# Patient Record
Sex: Female | Born: 1975 | Race: White | Hispanic: No | Smoking: Never smoker
Health system: Southern US, Community
[De-identification: ages and names within clinical notes are randomized; demographics above are authoritative.]

---

## 2006-03-24 ENCOUNTER — Inpatient Hospital Stay: Payer: Self-pay | Admitting: Obstetrics and Gynecology

## 2010-08-24 ENCOUNTER — Ambulatory Visit: Payer: Self-pay | Admitting: Family Medicine

## 2011-03-25 ENCOUNTER — Ambulatory Visit: Payer: Self-pay

## 2012-10-15 ENCOUNTER — Emergency Department: Payer: Self-pay | Admitting: Emergency Medicine

## 2012-10-15 LAB — COMPREHENSIVE METABOLIC PANEL
Albumin: 4.1 g/dL (ref 3.4–5.0)
Alkaline Phosphatase: 89 U/L (ref 50–136)
BUN: 10 mg/dL (ref 7–18)
Bilirubin,Total: 0.2 mg/dL (ref 0.2–1.0)
Calcium, Total: 8.8 mg/dL (ref 8.5–10.1)
Co2: 29 mmol/L (ref 21–32)
Creatinine: 0.75 mg/dL (ref 0.60–1.30)
EGFR (African American): >60
EGFR (Non-African Amer.): >60
Glucose: 98 mg/dL (ref 65–99)
Osmolality: 277 (ref 275–301)
Potassium: 3.7 mmol/L (ref 3.5–5.1)
SGOT(AST): 25 U/L (ref 15–37)
Total Protein: 7.5 g/dL (ref 6.4–8.2)

## 2012-10-15 LAB — CBC WITH DIFFERENTIAL/PLATELET
Eosinophil #: 0.1 10*3/uL (ref 0.0–0.7)
Eosinophil %: 0.5 %
HCT: 39.4 % (ref 35.0–47.0)
Lymphocyte #: 1.4 10*3/uL (ref 1.0–3.6)
MCH: 31 pg (ref 26.0–34.0)
MCHC: 33.8 g/dL (ref 32.0–36.0)
Monocyte #: 0.7 x10 3/mm (ref 0.2–0.9)
Monocyte %: 5 %
Neutrophil %: 82.8 %
Platelet: 277 10*3/uL (ref 150–440)
RDW: 13.6 % (ref 11.5–14.5)

## 2013-03-19 ENCOUNTER — Emergency Department: Payer: Self-pay | Admitting: Internal Medicine

## 2013-04-03 ENCOUNTER — Ambulatory Visit: Payer: Self-pay | Admitting: Orthopedic Surgery

## 2013-08-21 ENCOUNTER — Ambulatory Visit: Payer: Self-pay | Admitting: Family Medicine

## 2013-08-30 ENCOUNTER — Ambulatory Visit: Payer: Self-pay | Admitting: Family Medicine

## 2015-01-15 ENCOUNTER — Other Ambulatory Visit: Payer: Self-pay | Admitting: Family Medicine

## 2015-01-15 DIAGNOSIS — E042 Nontoxic multinodular goiter: Secondary | ICD-10-CM

## 2015-01-17 ENCOUNTER — Ambulatory Visit: Admission: RE | Admit: 2015-01-17 | Payer: Self-pay | Source: Ambulatory Visit

## 2015-01-21 ENCOUNTER — Ambulatory Visit
Admission: RE | Admit: 2015-01-21 | Discharge: 2015-01-21 | Disposition: A | Discharge status: Home / Self Care | Payer: 59 | Source: Ambulatory Visit | Attending: Family Medicine | Admitting: Family Medicine

## 2015-01-21 DIAGNOSIS — E042 Nontoxic multinodular goiter: Secondary | ICD-10-CM

## 2015-07-07 ENCOUNTER — Encounter: Payer: Self-pay | Admitting: Gynecology

## 2015-07-07 ENCOUNTER — Ambulatory Visit
Admission: EM | Admit: 2015-07-07 | Discharge: 2015-07-07 | Disposition: A | Discharge status: Home / Self Care | Payer: 59 | Attending: Family Medicine | Admitting: Family Medicine

## 2015-07-07 DIAGNOSIS — J9801 Acute bronchospasm: Secondary | ICD-10-CM

## 2015-07-07 DIAGNOSIS — H6503 Acute serous otitis media, bilateral: Secondary | ICD-10-CM | POA: Diagnosis not present

## 2015-07-07 MED ORDER — ALBUTEROL SULFATE HFA 108 (90 BASE) MCG/ACT IN AERS: 2.0000 | INHALATION_SPRAY | Freq: Four times a day (QID) | RESPIRATORY_TRACT | Status: AC | PRN

## 2015-07-07 NOTE — ED Notes (Signed)
Patient c/o x 2 weeks ago congestion coughing fever of 101. Patient stated saw her PCP x 1 week ago and diagnose with ear infection an is on Augmentin for 10 days. However patient stated is having chest congestion.

## 2015-07-07 NOTE — ED Provider Notes (Signed)
CSN: [id number]     Arrival date & time 07/07/15  1228 History   First MD Initiated Contact with Patient 07/07/15 1511     Chief Complaint  Patient presents with  . URI   (Consider location/radiation/quality/duration/timing/severity/associated sxs/prior Treatment) Patient is a 40 y.o. female presenting with URI. The history is provided by the patient.  URI Presenting symptoms: congestion, cough, fatigue and fever   Severity:  Moderate Onset quality:  Sudden Duration:  14 days Timing:  Constant Progression:  Improving Chronicity:  New Relieved by:  Prescription medications (patient on Augmentin prescribed by for ear infection) Associated symptoms: wheezing   Associated symptoms: no sinus pain   Risk factors: not elderly, no chronic cardiac disease, no chronic kidney disease, no chronic respiratory disease, no diabetes mellitus, no immunosuppression, no recent illness and no recent travel     History reviewed. No pertinent past medical history. History reviewed. No pertinent past surgical history. No family history on file. Social History  Substance Use Topics  . Smoking status: Never Smoker   . Smokeless tobacco: None  . Alcohol Use: No   OB History    No data available     Review of Systems  Constitutional: Positive for fever and fatigue.  HENT: Positive for congestion.   Respiratory: Positive for cough and wheezing.     Allergies  Review of patient's allergies indicates no known allergies.  Home Medications   Prior to Admission medications   Medication Sig Start Date End Date Taking? Authorizing Provider  Amoxicillin-Pot Clavulanate (AUGMENTIN PO) Take by mouth.   Yes Historical Provider, MD  albuterol (PROVENTIL HFA;VENTOLIN HFA) 108 (90 Base) MCG/ACT inhaler Inhale 2 puffs into the lungs every 6 (six) hours as needed for wheezing or shortness of breath. 07/07/15   Payton Mccallum, MD   Meds Ordered and Administered this Visit  Medications - No data to  display  BP 121/93 mmHg  Pulse 76  Temp(Src) 98 F (36.7 C) (Oral)  Resp 18  Ht  (1.549 m)  Wt 132 lb (59.875 kg)  BMI 24.95 kg/m2  SpO2 99%  LMP 07/07/2015 No data found.   Physical Exam  Constitutional: She appears well-developed and well-nourished. No distress.  HENT:  Head: Normocephalic and atraumatic.  Right Ear: Tympanic membrane, external ear and ear canal normal.  Left Ear: Tympanic membrane, external ear and ear canal normal.  Nose: No mucosal edema, rhinorrhea, nose lacerations, sinus tenderness, nasal deformity, septal deviation or nasal septal hematoma. No epistaxis.  No foreign bodies. Right sinus exhibits no maxillary sinus tenderness and no frontal sinus tenderness. Left sinus exhibits no maxillary sinus tenderness and no frontal sinus tenderness.  Mouth/Throat: Uvula is midline, oropharynx is clear and moist and mucous membranes are normal. No oropharyngeal exudate.  Eyes: Conjunctivae and EOM are normal. Pupils are equal, round, and reactive to light. Right eye exhibits no discharge. Left eye exhibits no discharge. No scleral icterus.  Neck: Normal range of motion. Neck supple. No thyromegaly present.  Cardiovascular: Normal rate, regular rhythm and normal heart sounds.   Pulmonary/Chest: Effort normal. No respiratory distress. She has wheezes (few mild expiratory ). She has no rales.  Lymphadenopathy:    She has no cervical adenopathy.  Skin: She is not diaphoretic.  Nursing note and vitals reviewed.   ED Course  Procedures (including critical care time)  Labs Review Labs Reviewed - No data to display  Imaging Review No results found.   Visual Acuity Review  Right Eye Distance:  Left Eye Distance:   Bilateral Distance:    Right Eye Near:   Left Eye Near:    Bilateral Near:         MDM   1. Bronchospasm   2. Bilateral acute serous otitis media, recurrence not specified     Discharge Medication List as of 07/07/2015  3:25 PM     START taking these medications   Details  albuterol (PROVENTIL HFA;VENTOLIN HFA) 108 (90 Base) MCG/ACT inhaler Inhale 2 puffs into the lungs every 6 (six) hours as needed for wheezing or shortness of breath., Starting 07/07/2015, Until Discontinued, Normal       1. diagnosis reviewed with patient 2. rx as per orders above; reviewed possible side effects, interactions, risks and benefits  3. Recommend continue and finish current antibiotic course with augmentin 4. Follow-up prn if symptoms worsen or don't improve    Payton Mccallum, MD 07/11/15 1318

## 2015-07-18 ENCOUNTER — Ambulatory Visit
Admission: RE | Admit: 2015-07-18 | Discharge: 2015-07-18 | Disposition: A | Discharge status: Home / Self Care | Payer: 59 | Source: Ambulatory Visit | Attending: Family Medicine | Admitting: Family Medicine

## 2015-07-18 ENCOUNTER — Other Ambulatory Visit: Payer: Self-pay | Admitting: Family Medicine

## 2015-07-18 DIAGNOSIS — R059 Cough, unspecified: Secondary | ICD-10-CM

## 2015-07-18 DIAGNOSIS — R05 Cough: Secondary | ICD-10-CM

## 2015-08-21 ENCOUNTER — Ambulatory Visit
Admission: EM | Admit: 2015-08-21 | Discharge: 2015-08-21 | Disposition: A | Discharge status: Home / Self Care | Payer: 59 | Attending: Family Medicine | Admitting: Family Medicine

## 2015-08-21 ENCOUNTER — Encounter: Payer: Self-pay | Admitting: Emergency Medicine

## 2015-08-21 ENCOUNTER — Ambulatory Visit (INDEPENDENT_AMBULATORY_CARE_PROVIDER_SITE_OTHER): Payer: 59

## 2015-08-21 DIAGNOSIS — L089 Local infection of the skin and subcutaneous tissue, unspecified: Secondary | ICD-10-CM | POA: Diagnosis not present

## 2015-08-21 DIAGNOSIS — S92912A Unspecified fracture of left toe(s), initial encounter for closed fracture: Secondary | ICD-10-CM

## 2015-08-21 MED ORDER — AMOXICILLIN 500 MG PO CAPS: 500.0000 mg | ORAL_CAPSULE | Freq: Two times a day (BID) | ORAL | Status: DC

## 2015-08-21 NOTE — ED Notes (Signed)
Pt has pain left 4th toe, bruising. Pt reports she stubbed her toe on concrete 2 days ago. Also has area of redness with pain just below toe nail.  Pt thinks its a piece of bone sticking out, but also looks like possible infection.

## 2015-08-21 NOTE — ED Provider Notes (Signed)
CSN: [id number]     Arrival date & time 08/21/15  [id number] History   First MD Initiated Contact with Patient 08/21/15 [phone]     Chief Complaint  Patient presents with  . Toe Pain   (Consider location/radiation/quality/duration/timing/severity/associated sxs/prior Treatment) HPI: She presents today with left fourth toe bruising. Patient states that she stubbed her toe on concrete about 2 days ago. The area is now red and painful. She feels as if there could be a fracture of the toe. She denies any drainage from the toe or any bleeding from the toe. She has been limping due to the discomfort. She denies any previous injury to the toe in the past. She denies any other injury anywhere else.  History reviewed. No pertinent past medical history. History reviewed. No pertinent past surgical history. History reviewed. No pertinent family history. Social History  Substance Use Topics  . Smoking status: Never Smoker   . Smokeless tobacco: None  . Alcohol Use: No   OB History    No data available     Review of Systems: Negative except mentioned above.  Allergies  Review of patient's allergies indicates no known allergies.  Home Medications   Prior to Admission medications   Medication Sig Start Date End Date Taking? Authorizing Provider  albuterol (PROVENTIL HFA;VENTOLIN HFA) 108 (90 Base) MCG/ACT inhaler Inhale 2 puffs into the lungs every 6 (six) hours as needed for wheezing or shortness of breath. 07/07/15   Payton Mccallumrlando Conty, MD  Amoxicillin-Pot Clavulanate (AUGMENTIN PO) Take by mouth.    Historical Provider, MD   Meds Ordered and Administered this Visit  Medications - No data to display  BP 120/89 mmHg  Pulse 68  Temp(Src) 98 F (36.7 C) (Oral)  Resp 18  Ht 5\' 1"  (1.549 m)  Wt 141 lb (63.957 kg)  BMI 26.66 kg/m2  SpO2 98% No data found.   Physical Exam  GENERAL: NAD RESP: CTA B CARD: RRR EXTREM: L Foot- moderate tenderness and erythema distally along the dorsal surface of the  fourth toe, has mildly decreased range of motion due to pain and swelling, no discharge or bleeding noted, neurovascularly intact NEURO: CN II-XII grossly intact   ED Course  Procedures (including critical care time)  Labs Review Labs Reviewed - No data to display  Imaging Review No results found.    MDM  A/P: L 4th Toe Fracture- Small avulsion fracture noted on x-ray from the distal aspect of the toe, given the redness and tenderness of the area that is focal I will treat with antibiotic as well in case the patient does have an abrasion over that area that is causing infection, of asked the patient to keep the area dry and clean and to watch for any further signs of infection, she can buddy tape the toe to the adjacent toe. If she has any further problems with the toe she follow up with podiatry as discussed. I have offered a postop shoe for her if she would like to wear this as well.    Jolene ProvostKirtida Mahesh Sizemore, MD 08/21/15 [phone]

## 2015-08-26 ENCOUNTER — Ambulatory Visit (INDEPENDENT_AMBULATORY_CARE_PROVIDER_SITE_OTHER): Payer: 59

## 2015-08-26 ENCOUNTER — Encounter: Payer: Self-pay | Admitting: Podiatry

## 2015-08-26 ENCOUNTER — Ambulatory Visit (INDEPENDENT_AMBULATORY_CARE_PROVIDER_SITE_OTHER): Payer: 59 | Admitting: Podiatry

## 2015-08-26 VITALS — BP 120/78 | HR 72 | Resp 16

## 2015-08-26 DIAGNOSIS — M6740 Ganglion, unspecified site: Secondary | ICD-10-CM | POA: Diagnosis not present

## 2015-08-26 DIAGNOSIS — S92912A Unspecified fracture of left toe(s), initial encounter for closed fracture: Secondary | ICD-10-CM | POA: Diagnosis not present

## 2015-08-26 DIAGNOSIS — S99922A Unspecified injury of left foot, initial encounter: Secondary | ICD-10-CM

## 2015-08-26 NOTE — Progress Notes (Signed)
   Subjective:    Patient ID: Brandi Sosa, female    DOB: 11/12/1975, 40 y.o.   MRN: [id number]  HPI: She presents today with a week long duration of a painful fourth digit left foot. She states that one week ago if she hit the toe against a concrete barrier. She has since gone to the ER who stated she has small fracture dorsal aspect of the DIPJ.    Review of Systems  Musculoskeletal: Positive for back pain.  All other systems reviewed and are negative.      Objective:   Physical Exam: Vital signs are stable she is alert and oriented 3 pulses are palpable. Neurologic sensorium is intact. Deep tendon reflexes are intact. Muscle strength is normal bilateral. Rectus foot type with mallet toe deformity fourth digit left foot with tenderness on palpation overlying the DIPJ and mild erythema. Cutaneous evaluation demonstrates no open lesions or wounds. However radiographs reviewed does demonstrate what appears to be a small avulsion fracture at the distal aspect of the extensor digitorum longus tendon slip. She is unable to extend the joint at the level of the DIPJ. More than likely she has not only an avulsion fracture here but also a avulsion of the extensor tendon. There also appears to be a small ganglion or mucoid cyst developing medial aspect just proximal to the proximal nail fold.        Assessment & Plan:  Avulsion fracture distal phalanx fourth digit left foot. Also will mucoid cyst.  Plan: We discussed the etiology pathology conservative versus surgical therapies. We discussed the possible need for surgical correction. I demonstrated to her how to dress the toe with compression dressing and she will do so for another 6 weeks I will follow-up with her at that time.

## 2015-10-09 ENCOUNTER — Encounter (INDEPENDENT_AMBULATORY_CARE_PROVIDER_SITE_OTHER): Payer: 59 | Admitting: Podiatry

## 2015-10-09 NOTE — Progress Notes (Signed)
This encounter was created in error - please disregard.

## 2017-06-21 ENCOUNTER — Other Ambulatory Visit: Payer: Self-pay | Admitting: Family Medicine

## 2017-06-21 DIAGNOSIS — E042 Nontoxic multinodular goiter: Secondary | ICD-10-CM

## 2017-06-25 ENCOUNTER — Ambulatory Visit: Payer: Managed Care, Other (non HMO) | Attending: Family Medicine

## 2017-10-29 IMAGING — CR DG CHEST 2V
2 series · 2 of 2 positions shown · non-contrast
Comparison: Of

CLINICAL DATA: Pneumonia July 08, 2015, patient still
experiencing productive cough shortness of breath and fever

EXAM:
CHEST  2 VIEW

[chest pa]
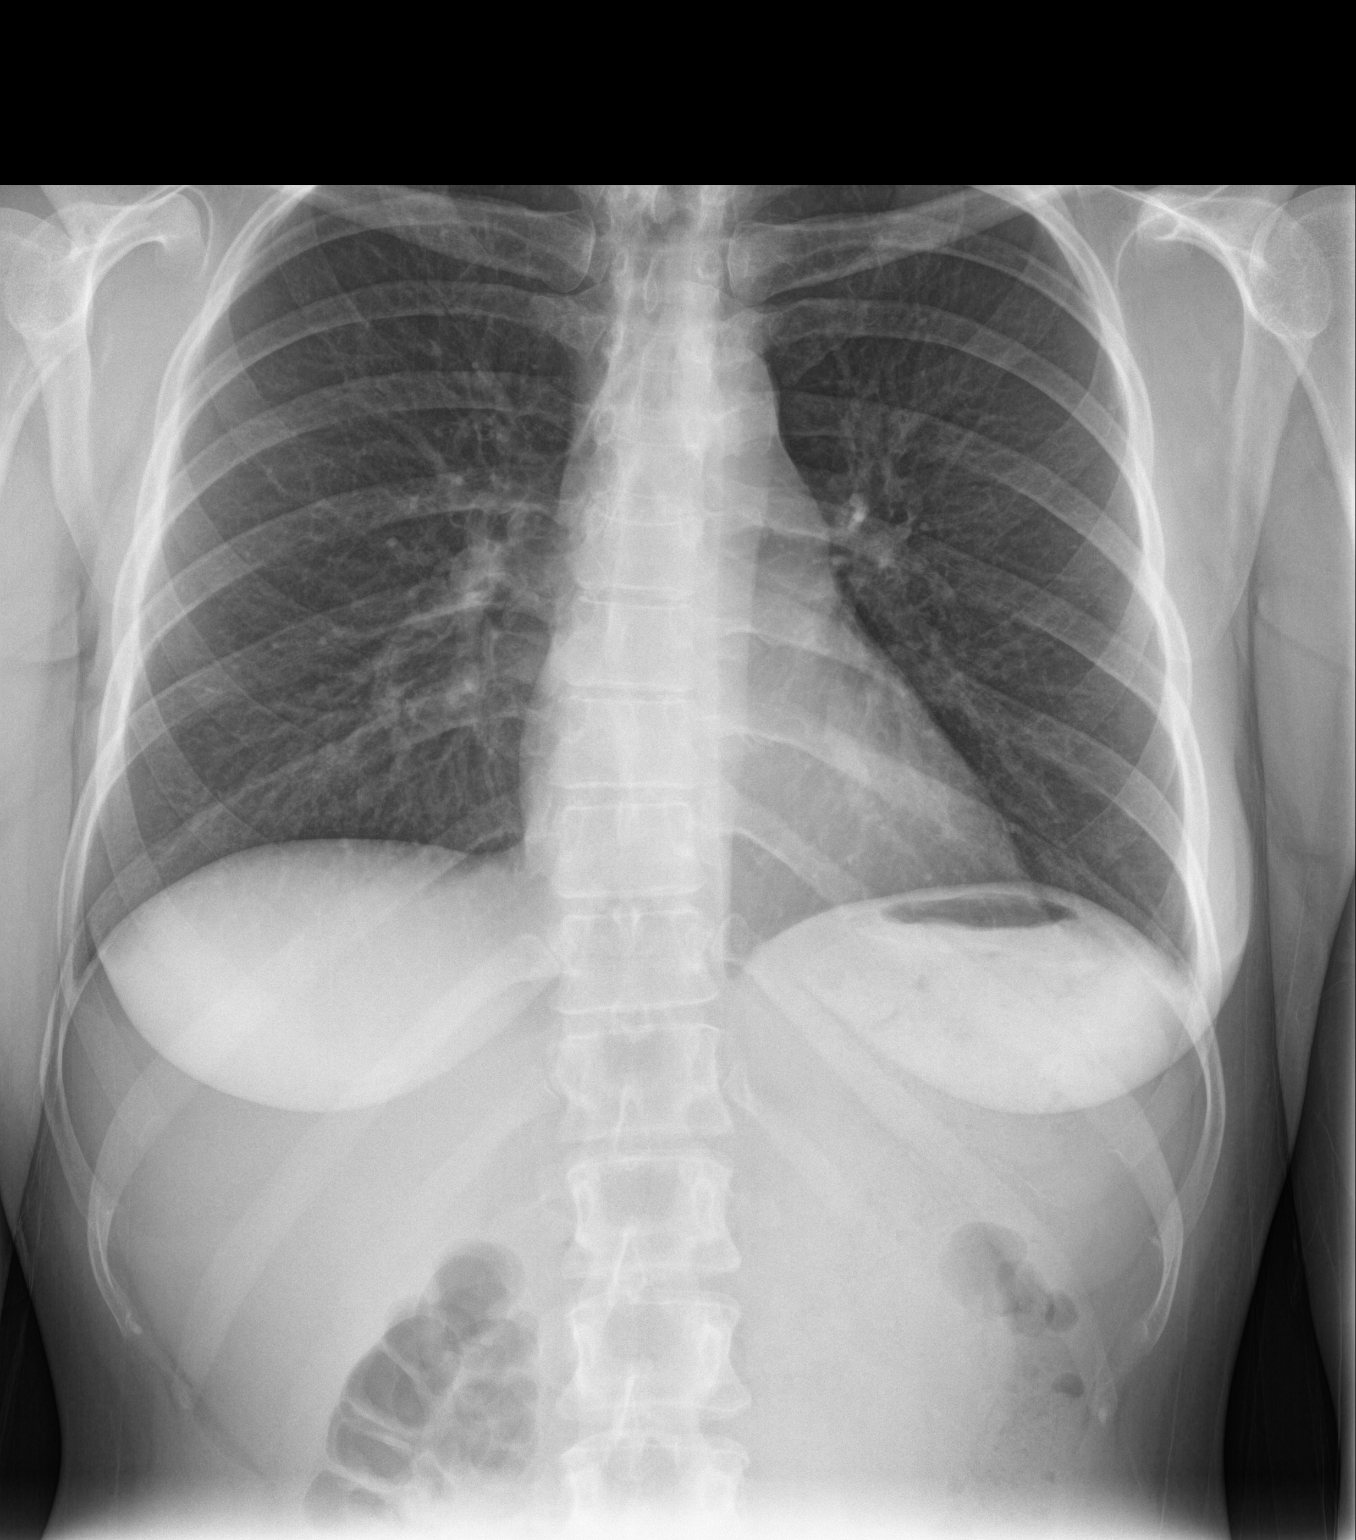

[chest lat]
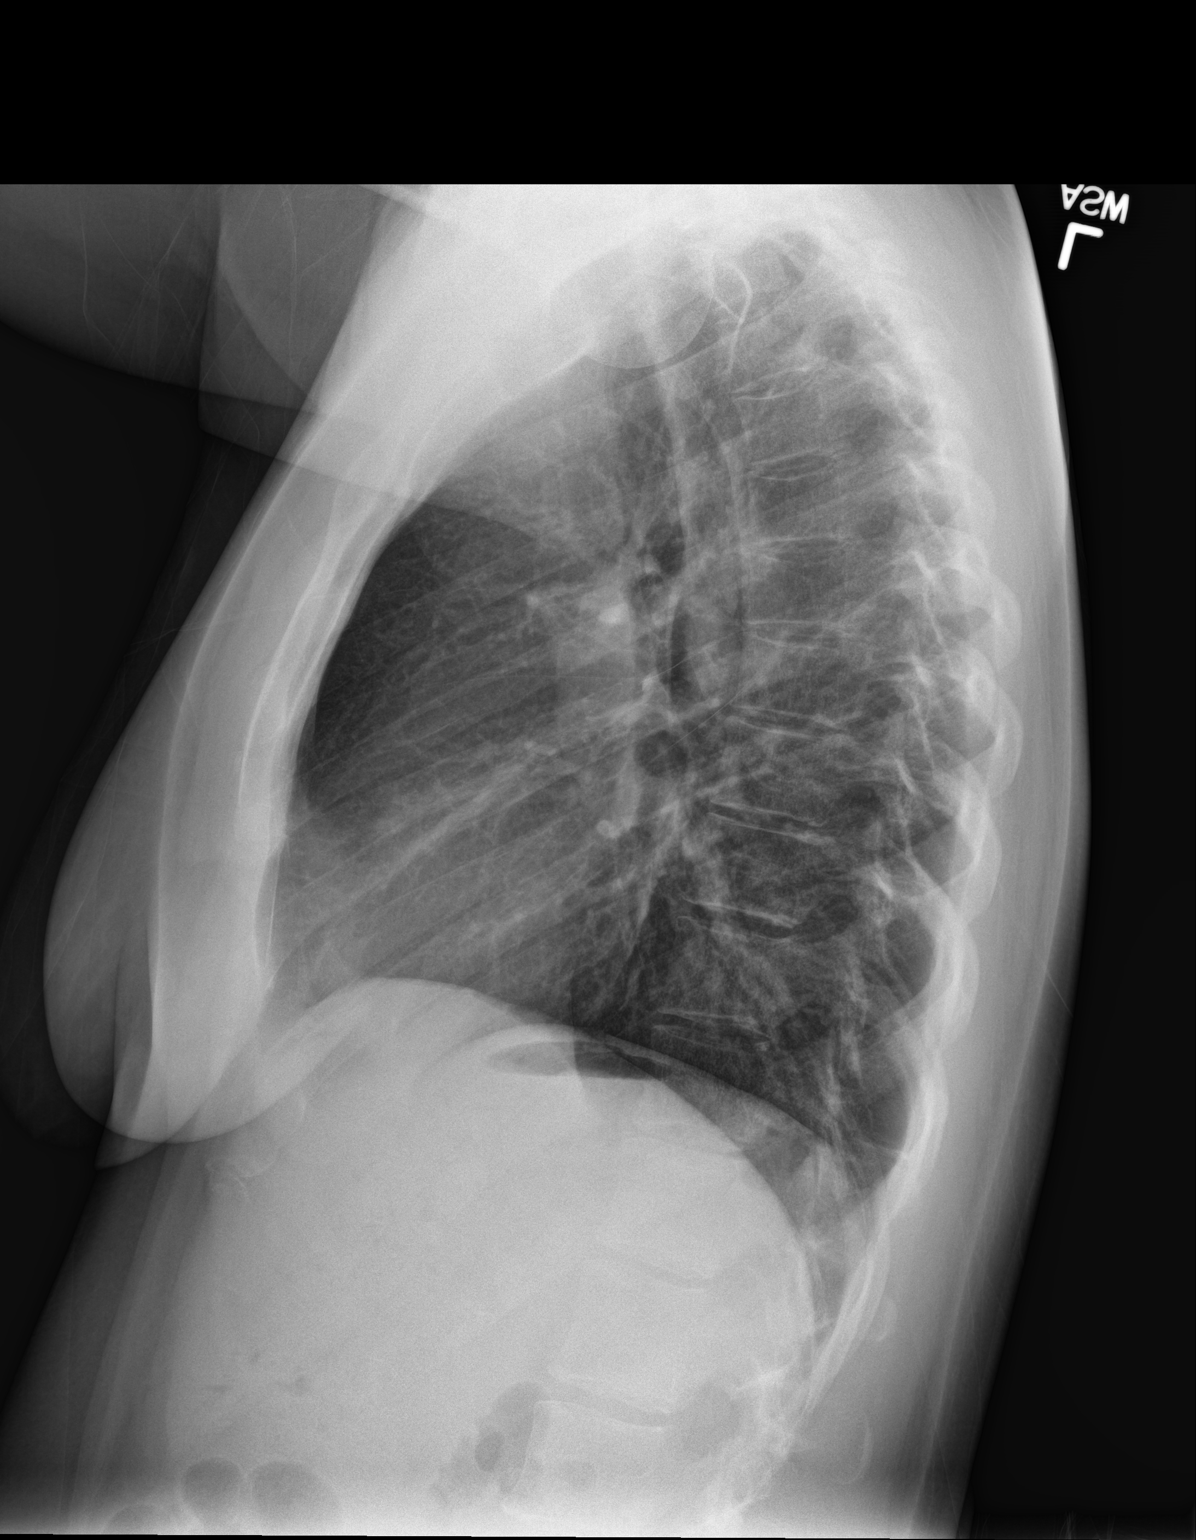

[2 of 2 positions shown; findings below may reference images not displayed]

FINDINGS: The heart size and vascular pattern are normal. There is no
infiltrate or consolidation. There is no pleural effusion. Minimal
bronchitic change.
IMPRESSION: Minimal bronchitic change of unknown chronicity. No evidence of
infectious infiltrate.

## 2017-12-02 IMAGING — CR DG TOE 4TH 2+V*L*
3 series · 3 of 3 positions shown · non-contrast
Comparison: None.

CLINICAL DATA: Injured toe 2 days ago with pain and swelling

EXAM:
LEFT FOURTH TOE

[toe ap]
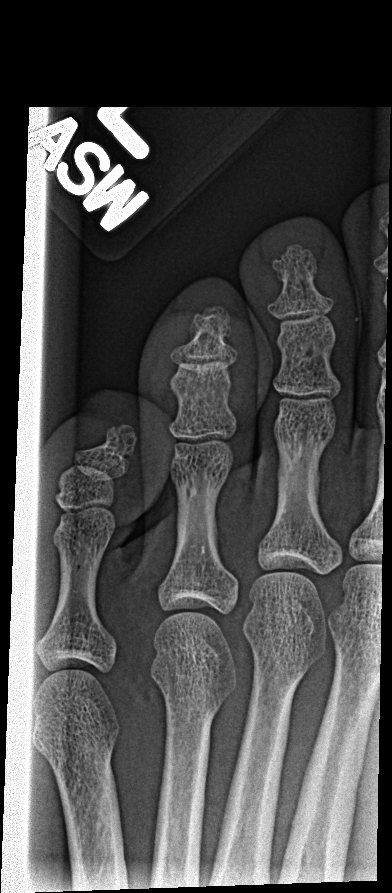

[toe obl]
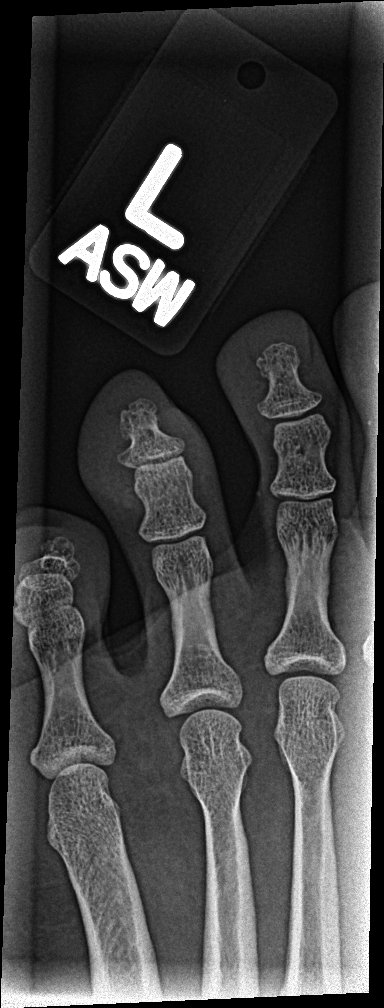

[toe lat]
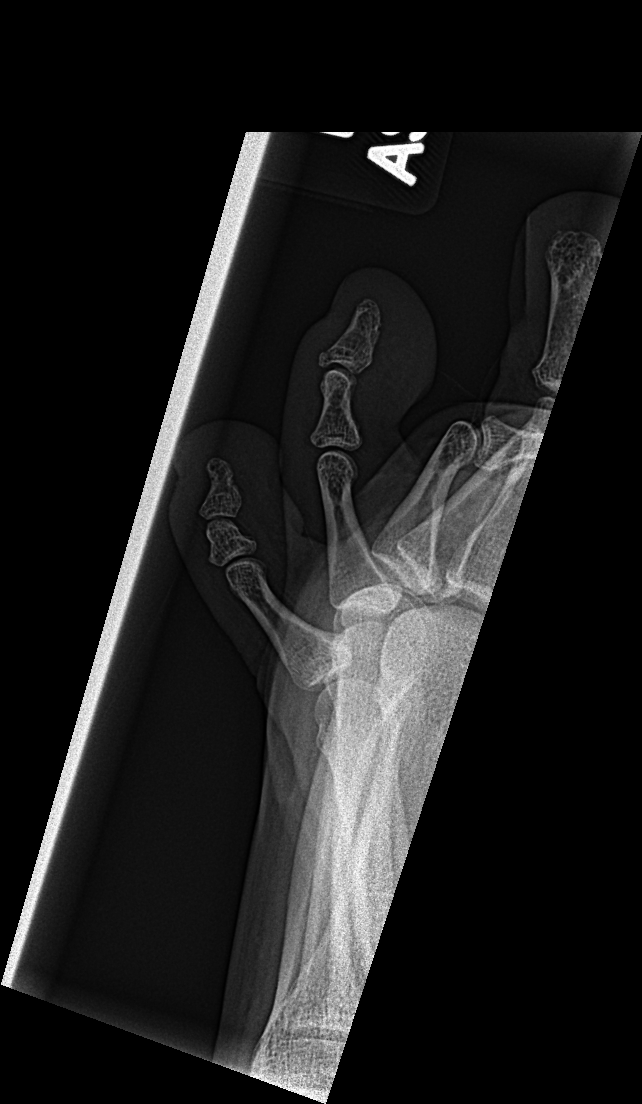

[3 of 3 positions shown; findings below may reference images not displayed]

FINDINGS: There is a small avulsion fracture fragment from the base of the
distal phalanx of the left fourth toe with adjacent soft tissue
swelling. No other acute abnormality is seen.
IMPRESSION: Small avulsion fracture fragment from the base of the distal phalanx
of the left fourth toe.
